# Patient Record
Sex: Male | Born: 1990 | Race: White | Hispanic: No | Marital: Single | Smoking: Never smoker
Health system: Southern US, Community
[De-identification: ages and names within clinical notes are randomized; demographics above are authoritative.]

---

## 2014-02-20 ENCOUNTER — Emergency Department (HOSPITAL_COMMUNITY): Payer: BC Managed Care – PPO

## 2014-02-20 ENCOUNTER — Encounter (HOSPITAL_COMMUNITY): Payer: Self-pay | Admitting: *Deleted

## 2014-02-20 ENCOUNTER — Emergency Department (HOSPITAL_COMMUNITY)
Admission: EM | Admit: 2014-02-20 | Discharge: 2014-02-20 | Disposition: A | Discharge status: Home / Self Care | Payer: BC Managed Care – PPO | Attending: Emergency Medicine | Admitting: Emergency Medicine

## 2014-02-20 DIAGNOSIS — S6991XA Unspecified injury of right wrist, hand and finger(s), initial encounter: Secondary | ICD-10-CM | POA: Diagnosis present

## 2014-02-20 DIAGNOSIS — Y9289 Other specified places as the place of occurrence of the external cause: Secondary | ICD-10-CM | POA: Insufficient documentation

## 2014-02-20 DIAGNOSIS — Y9368 Activity, volleyball (beach) (court): Secondary | ICD-10-CM | POA: Diagnosis not present

## 2014-02-20 DIAGNOSIS — Y998 Other external cause status: Secondary | ICD-10-CM | POA: Diagnosis not present

## 2014-02-20 DIAGNOSIS — S63259A Unspecified dislocation of unspecified finger, initial encounter: Secondary | ICD-10-CM

## 2014-02-20 DIAGNOSIS — S63286A Dislocation of proximal interphalangeal joint of right little finger, initial encounter: Secondary | ICD-10-CM | POA: Insufficient documentation

## 2014-02-20 DIAGNOSIS — W2106XA Struck by volleyball, initial encounter: Secondary | ICD-10-CM | POA: Diagnosis not present

## 2014-02-20 DIAGNOSIS — Z880 Allergy status to penicillin: Secondary | ICD-10-CM | POA: Diagnosis not present

## 2014-02-20 MED ORDER — IBUPROFEN 800 MG PO TABS: 800.0000 mg | ORAL_TABLET | Freq: Three times a day (TID) | ORAL | Status: AC

## 2014-02-20 MED ORDER — BUPIVACAINE HCL 0.5 % IJ SOLN
50.0000 mL | Freq: Once | INTRAMUSCULAR | Status: DC
  Filled 2014-02-20: qty 50

## 2014-02-20 NOTE — Discharge Instructions (Signed)
Finger Dislocation °Finger dislocation is the displacement of bones in your finger at the joints. Most commonly, finger dislocation occurs at the proximal interphalangeal joint (the joint closest to your knuckle). Very strong, fibrous tissues (ligaments) and joint capsules connect the three bones of your fingers.  °CAUSES °Dislocation is caused by a forceful impact. This impact moves these bones off the joint and often tears your ligaments.  °SYMPTOMS °Symptoms of finger dislocation include: °· Deformity of your finger. °· Pain, with loss of movement. °DIAGNOSIS  °Finger dislocation is diagnosed with a physical exam. Often, X-ray exams are done to see if you have associated injuries, such as bone fractures. °TREATMENT  °Finger dislocations are treated by putting your bones back into position (reduction) either by manually moving the bones back into place or through surgery. Your finger is then kept in a fixed position (immobilized) with the use of a dressing or splint for a brief period. °When your ligament has to be surgically repaired, it needs to be kept in a fixed position with a dressing or splint for 1 to 2 weeks. Because joint stiffness is a long-term complication of finger dislocation, hand exercises or physical therapy to increase the range of motion and to regain strength is usually started as soon as the ligament is healed. Exercises and therapy generally last no more than 3 months. °HOME CARE INSTRUCTIONS °The following measures can help to reduce pain and speed up the healing process: °· Rest your injured joint. Do not move until instructed otherwise by your caregiver. Avoid activities similar to the one that caused your injury. °· Apply ice to your injured joint for the first day or 2 after your reduction or as directed by your caregiver. Applying ice helps to reduce inflammation and pain. °¨ Put ice in a plastic bag. °¨ Place a towel between your skin and the bag. °¨ Leave the ice on for 15-20 minutes  at a time, every 2 hours while you are awake. °· Elevate your hand above your heart as directed by your caregiver to reduce swelling. °· Take over-the-counter or prescription medicine for pain as your caregiver instructs you. °SEEK IMMEDIATE MEDICAL CARE IF: °· Your dressing or splint becomes damaged. °· Your pain becomes worse rather than better. °· You lose feeling in your finger, or it becomes cold and white. °MAKE SURE YOU: °· Understand these instructions. °· Will watch your condition. °· Will get help right away if you are not doing well or get worse. °Document Released: 03/22/2000 Document Revised: 06/17/2011 Document Reviewed: 01/13/2011 °[employer]® Patient Information ©2015 [employer]. This information is not intended to replace advice given to you by your health care provider. Make sure you discuss any questions you have with your health care provider. ° °

## 2014-02-20 NOTE — ED Provider Notes (Signed)
CSN: [id number]     Arrival date & time 02/20/14  2156 History   First MD Initiated Contact with Patient 02/20/14 2226     This chart was scribed for non-physician practitioner, Elpidio AnisShari Yosmar Ryker PA-C, working with Audree CamelScott T Goldston, MD by Arlan OrganAshley Leger, ED Scribe. This patient was seen in room [facility] and the patient's care was started at 10:31 PM.   Chief Complaint  Patient presents with  . Finger Injury   The history is provided by the patient. No language interpreter was used.    HPI Comments: Thomas Shaffer is a 23 y.o. male who presents to the Emergency Department complaining of a finger injury to the R 5th finger sustained just prior to arrival. Pt states he was playing volleyball with some friends when he went to block the ball, hitting his finger. Pt presents with obvious deformity to R 5th digit and reports 10/10 pain to finger. No numbness, weakness, or loss of sensation. Pt with known allergy to amoxicillin. He has no pertinent past medical history. No other concerns this visit.   History reviewed. No pertinent past medical history. History reviewed. No pertinent past surgical history. No family history on file. History  Substance Use Topics  . Smoking status: Never Smoker   . Smokeless tobacco: Not on file  . Alcohol Use: Yes     Comment: socially    Review of Systems  Musculoskeletal: Positive for arthralgias.       Finger deformity.  Skin: Negative for wound.  Neurological: Negative for numbness.  All other systems reviewed and are negative.     Allergies  Amoxicillin  Home Medications   Prior to Admission medications   Not on File   Triage Vitals: BP 135/80 mmHg  Pulse 97  Temp(Src) 98.2 F (36.8 C) (Oral)  Resp 19  Ht 5\' 11"  (1.803 m)  Wt 170 lb (77.111 kg)  BMI 23.72 kg/m2  SpO2 100%   Physical Exam  Constitutional: He is oriented to person, place, and time. He appears well-developed and well-nourished.  HENT:  Head: Normocephalic.  Eyes:  EOM are normal.  Neck: Normal range of motion.  Pulmonary/Chest: Effort normal.  Abdominal: He exhibits no distension.  Musculoskeletal:  Right fifth finger dislocated at PIP joint without other bony deformity.   Neurological: He is alert and oriented to person, place, and time.  Psychiatric: He has a normal mood and affect.  Nursing note and vitals reviewed.   ED Course  Procedures (including critical care time)  DIAGNOSTIC STUDIES: Oxygen Saturation is 100% on RA, Normal by my interpretation.    COORDINATION OF CARE: 10:31 PM-Discussed treatment plan with pt at bedside and pt agreed to plan.     Labs Review Labs Reviewed - No data to display  Imaging Review No results found.   EKG Interpretation None     Right 5th finger digitally blocked with 0.5% Marcaine, w/o epi.. Dislocation reduced with countertraction with normal alignment of joint obtained. Splint provided.  MDM   Final diagnoses:  None    1. Right 5th finger dislocation  Refer to hand for re-evaluation of tendon function.   I personally performed the services described in this documentation, which was scribed in my presence. The recorded information has been reviewed and is accurate.    Arnoldo HookerShari A Jacqueline Delapena, PA-C 02/20/14 2314  Audree CamelScott T Goldston, MD 03/02/14 [phone]

## 2014-02-20 NOTE — ED Notes (Signed)
PT states that he was playing volleyball and went to block and hit the ball; pt with deformity to rt 5th finger

## 2015-11-04 IMAGING — CR DG FINGER LITTLE 2+V*R*
3 series · 3 of 3 positions shown · non-contrast
Comparison: None.

CLINICAL DATA: Injury to the right fifth finger while playing
volleyball. Closed dislocation.

EXAM:
RIGHT LITTLE FINGER 2+V

[x finger pa right]
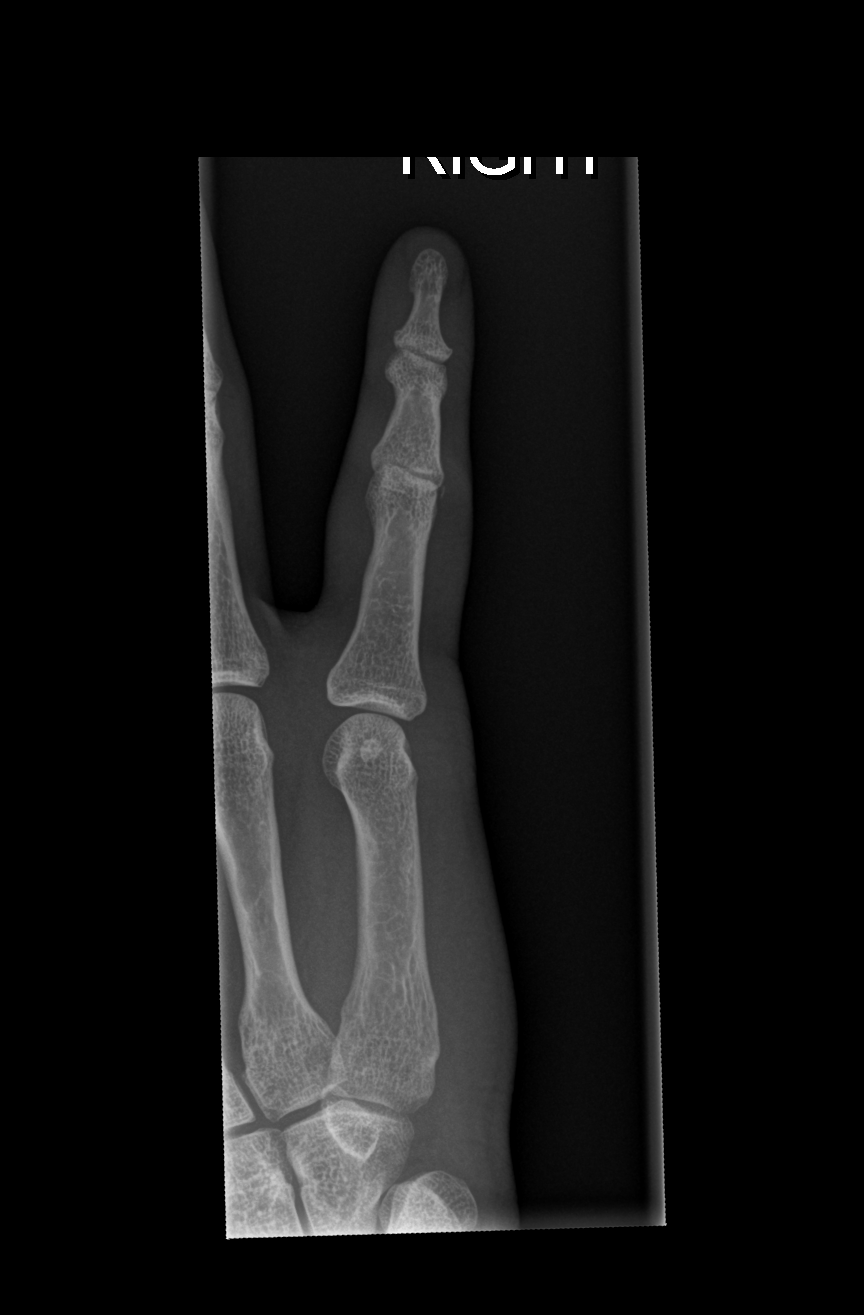

[x finger obl right]
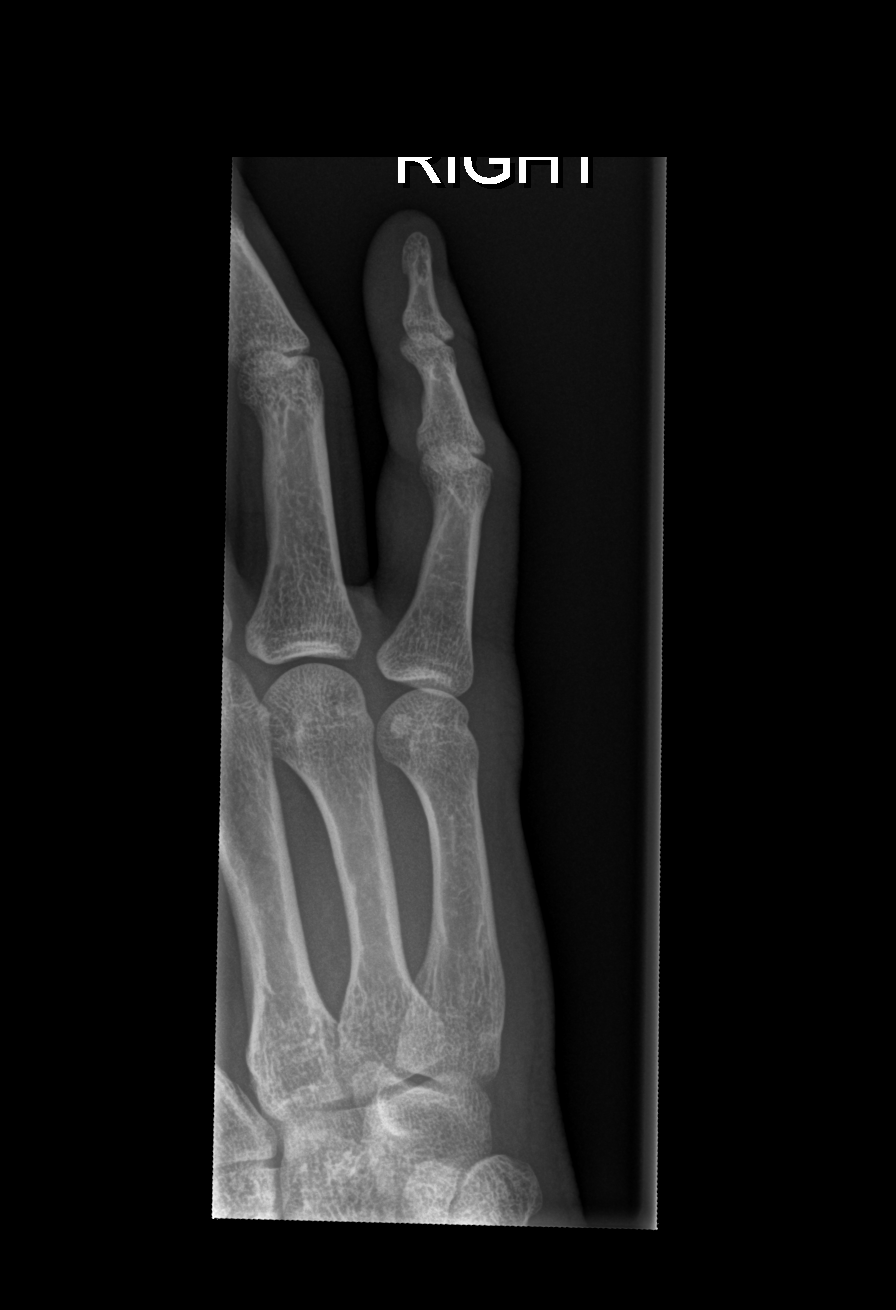

[x finger lat right]
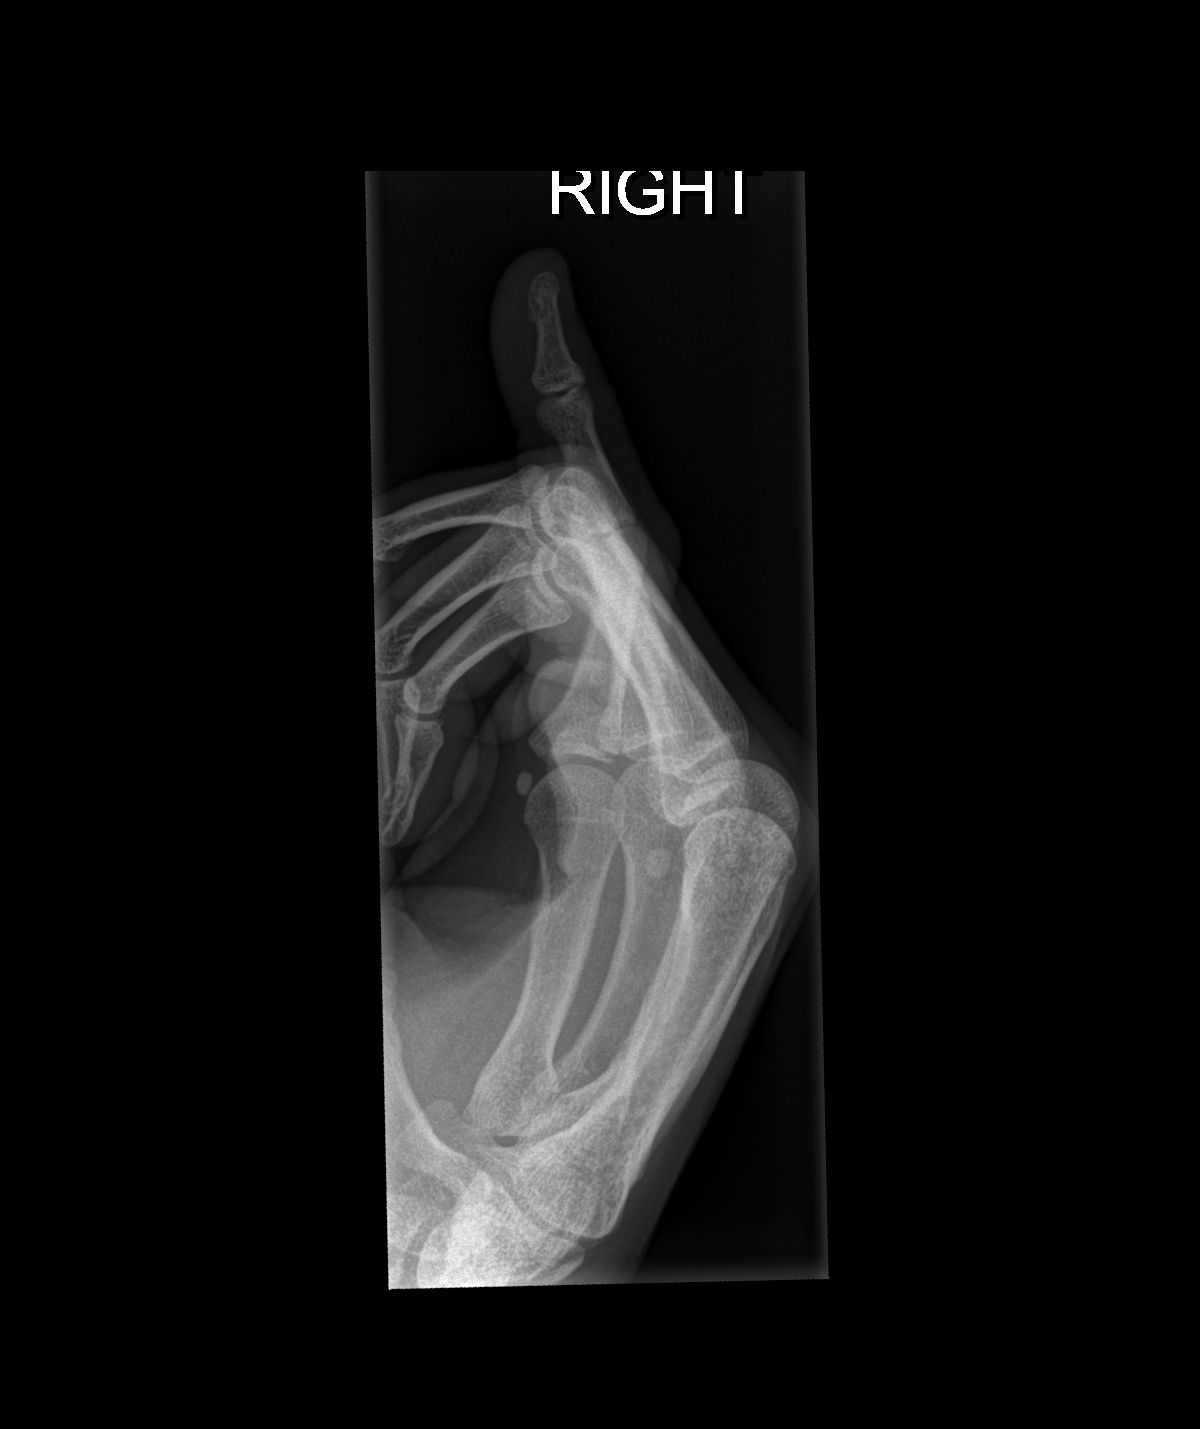

[3 of 3 positions shown; findings below may reference images not displayed]

FINDINGS: Tiny osseous fragment adjacent to the proximal interphalangeal joint
of the right fifth finger with associated soft tissue swelling
consistent with avulsion fracture. No evidence of dislocation.
IMPRESSION: Avulsion fracture at the proximal interphalangeal joint of the right
fifth finger.
